# Patient Record
Sex: Male | Born: 2000 | Race: Black or African American | Hispanic: No | Marital: Single | Smoking: Never smoker
Health system: Southern US, Community
[De-identification: ages and names within clinical notes are randomized; demographics above are authoritative.]

## PROBLEM LIST (undated history)

## (undated) HISTORY — PX: KNEE ARTHROSCOPY W/ ACL RECONSTRUCTION: SHX1858

## (undated) HISTORY — PX: NO PAST SURGERIES: SHX2092

---
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
## 2021-06-29 ENCOUNTER — Other Ambulatory Visit (HOSPITAL_COMMUNITY)
Admission: RE | Admit: 2021-06-29 | Discharge: 2021-06-29 | Disposition: A | Discharge status: Home / Self Care | Payer: Managed Care, Other (non HMO) | Source: Ambulatory Visit | Attending: Family Medicine | Admitting: Family Medicine

## 2021-06-29 ENCOUNTER — Encounter: Payer: Self-pay | Admitting: Emergency Medicine

## 2021-06-29 ENCOUNTER — Emergency Department (INDEPENDENT_AMBULATORY_CARE_PROVIDER_SITE_OTHER)
Admission: EM | Admit: 2021-06-29 | Discharge: 2021-06-29 | Disposition: A | Discharge status: Home / Self Care | Payer: Managed Care, Other (non HMO) | Source: Home / Self Care

## 2021-06-29 DIAGNOSIS — R300 Dysuria: Secondary | ICD-10-CM

## 2021-06-29 DIAGNOSIS — Z113 Encounter for screening for infections with a predominantly sexual mode of transmission: Secondary | ICD-10-CM | POA: Diagnosis not present

## 2021-06-29 DIAGNOSIS — Z202 Contact with and (suspected) exposure to infections with a predominantly sexual mode of transmission: Secondary | ICD-10-CM | POA: Diagnosis not present

## 2021-06-29 NOTE — Discharge Instructions (Addendum)
Advised patient we will follow-up with him once Aptima swab results return. ?

## 2021-06-29 NOTE — ED Triage Notes (Signed)
Burning w/ urinary sensation x 1 week ?Sexually active - normally wears a condom - did not this time approx 2 weeks ago  ?

## 2021-06-29 NOTE — ED Provider Notes (Signed)
?[facility] ? ? ? ?CSN: [id number] ?Arrival date & time: 06/29/21  1921 ? ? ?  ? ?History   ?Chief Complaint ?Chief Complaint  ?Patient presents with  ? Dysuria  ? ? ?HPI ?Timothy Orozco is a 21 y.o. male.  ? ?HPI 21 year old male presents with burning urinary symptoms for 1 week.  Patient reports unprotected sex approximately 2 weeks ago. ? ?History reviewed. No pertinent past medical history. ? ?Patient Active Problem List  ? Diagnosis Date Noted  ? Undiagnosed cardiac murmurs 09/11/2017  ? ? ?Past Surgical History:  ?Procedure Laterality Date  ? KNEE ARTHROSCOPY W/ ACL RECONSTRUCTION Left   ? ? ? ? ? ?Home Medications   ? ?Prior to Admission medications   ?Not on File  ? ? ?Family History ?Family History  ?Problem Relation Age of Onset  ? Hypertension Mother   ? Healthy Brother   ? Healthy Brother   ? Healthy Brother   ? Healthy Sister   ? ? ?Social History ?Social History  ? ?Tobacco Use  ? Smoking status: Never  ? Smokeless tobacco: Never  ?Vaping Use  ? Vaping Use: Never used  ?Substance Use Topics  ? Alcohol use: No  ?  Alcohol/week: 0.0 standard drinks  ? Drug use: No  ? ? ? ?Allergies   ?Patient has no known allergies. ? ? ?Review of Systems ?Review of Systems  ?Genitourinary:  Positive for dysuria.  ? ? ?Physical Exam ?Triage Vital Signs ?ED Triage Vitals  ?Enc Vitals Group  ?   BP 06/29/21 1932 117/78  ?   Pulse Rate 06/29/21 1932 68  ?   Resp 06/29/21 1932 15  ?   Temp 06/29/21 1932 98.8 ?F (37.1 ?C)  ?   Temp Source 06/29/21 1932 Oral  ?   SpO2 06/29/21 1932 98 %  ?   Weight 06/29/21 1934 139 lb (63 kg)  ?   Height --   ?   Head Circumference --   ?   Peak Flow --   ?   Pain Score 06/29/21 1934 0  ?   Pain Loc --   ?   Pain Edu? --   ?   Excl. in GC? --   ? ?No data found. ? ?Updated Vital Signs ?BP 117/78 (BP Location: Right Arm)   Pulse 68   Temp 98.8 ?F (37.1 ?C) (Oral)   Resp 15   Wt 139 lb (63 kg)   SpO2 98%  ? ?  ? ?Physical Exam ?Vitals and nursing note reviewed.  ?Constitutional:    ?   General: He is not in acute distress. ?   Appearance: Normal appearance. He is normal weight. He is not ill-appearing.  ?HENT:  ?   Head: Normocephalic and atraumatic.  ?   Mouth/Throat:  ?   Mouth: Mucous membranes are moist.  ?   Pharynx: Oropharynx is clear.  ?Eyes:  ?   Extraocular Movements: Extraocular movements intact.  ?   Conjunctiva/sclera: Conjunctivae normal.  ?   Pupils: Pupils are equal, round, and reactive to light.  ?Cardiovascular:  ?   Rate and Rhythm: Normal rate and regular rhythm.  ?   Pulses: Normal pulses.  ?   Heart sounds: Normal heart sounds.  ?Pulmonary:  ?   Effort: Pulmonary effort is normal.  ?   Breath sounds: Normal breath sounds. No wheezing, rhonchi or rales.  ?Musculoskeletal:  ?   Cervical back: Normal range of motion and neck supple.  ?Skin: ?  General: Skin is warm and dry.  ?Neurological:  ?   General: No focal deficit present.  ?   Mental Status: He is alert and oriented to person, place, and time. Mental status is at baseline.  ? ? ? ?UC Treatments / Results  ?Labs ?(all labs ordered are listed, but only abnormal results are displayed) ?Labs Reviewed  ?CYTOLOGY, (ORAL, ANAL, URETHRAL) ANCILLARY ONLY  ? ? ?EKG ? ? ?Radiology ?No results found. ? ?Procedures ?Procedures (including critical care time) ? ?Medications Ordered in UC ?Medications - No data to display ? ?Initial Impression / Assessment and Plan / UC Course  ?I have reviewed the triage vital signs and the nursing notes. ? ?Pertinent labs & imaging results that were available during my care of the patient were reviewed by me and considered in my medical decision making (see chart for details). ? ?  ? ?MDM: 1.  Potential exposure to STD-Aptima swab ordered. Advised patient we will follow-up with him once Aptima swab results return.  Patient discharged home, hemodynamically stable. ?Final Clinical Impressions(s) / UC Diagnoses  ? ?Final diagnoses:  ?Potential exposure to STD  ? ? ? ?Discharge Instructions   ? ?   ?Advised patient we will follow-up with him once Aptima swab results return. ? ? ? ? ?ED Prescriptions   ?None ?  ? ?PDMP not reviewed this encounter. ?  ?Trevor Iha, FNP ?06/29/21 2005 ? ?

## 2021-07-01 ENCOUNTER — Telehealth: Payer: Self-pay

## 2021-07-01 LAB — CYTOLOGY, (ORAL, ANAL, URETHRAL) ANCILLARY ONLY
Chlamydia: POSITIVE — AB
Comment: NEGATIVE
Comment: NEGATIVE
Comment: NORMAL
Neisseria Gonorrhea: NEGATIVE
Trichomonas: NEGATIVE

## 2021-07-01 MED ORDER — DOXYCYCLINE HYCLATE 100 MG PO CAPS: ORAL_CAPSULE | ORAL | 0 refills | Status: DC

## 2021-07-01 NOTE — Telephone Encounter (Signed)
Pt called regarding his test results. Pt was informed of results and wanted to know when can he start treatment. ? ? ?Pt uses [facility] on [address] in [place], [place]. ?

## 2021-07-01 NOTE — Telephone Encounter (Signed)
For chlamydia, begin doxycycline 100mg  BID for 7 days. ?

## 2021-09-03 IMAGING — DX DG CHEST 2V
2 series · 2 of 2 positions shown · non-contrast
Comparison: None.

CLINICAL DATA: Shortness of breath with cough and chest tightness

EXAM:
CHEST - 2 VIEW

[chest pa]
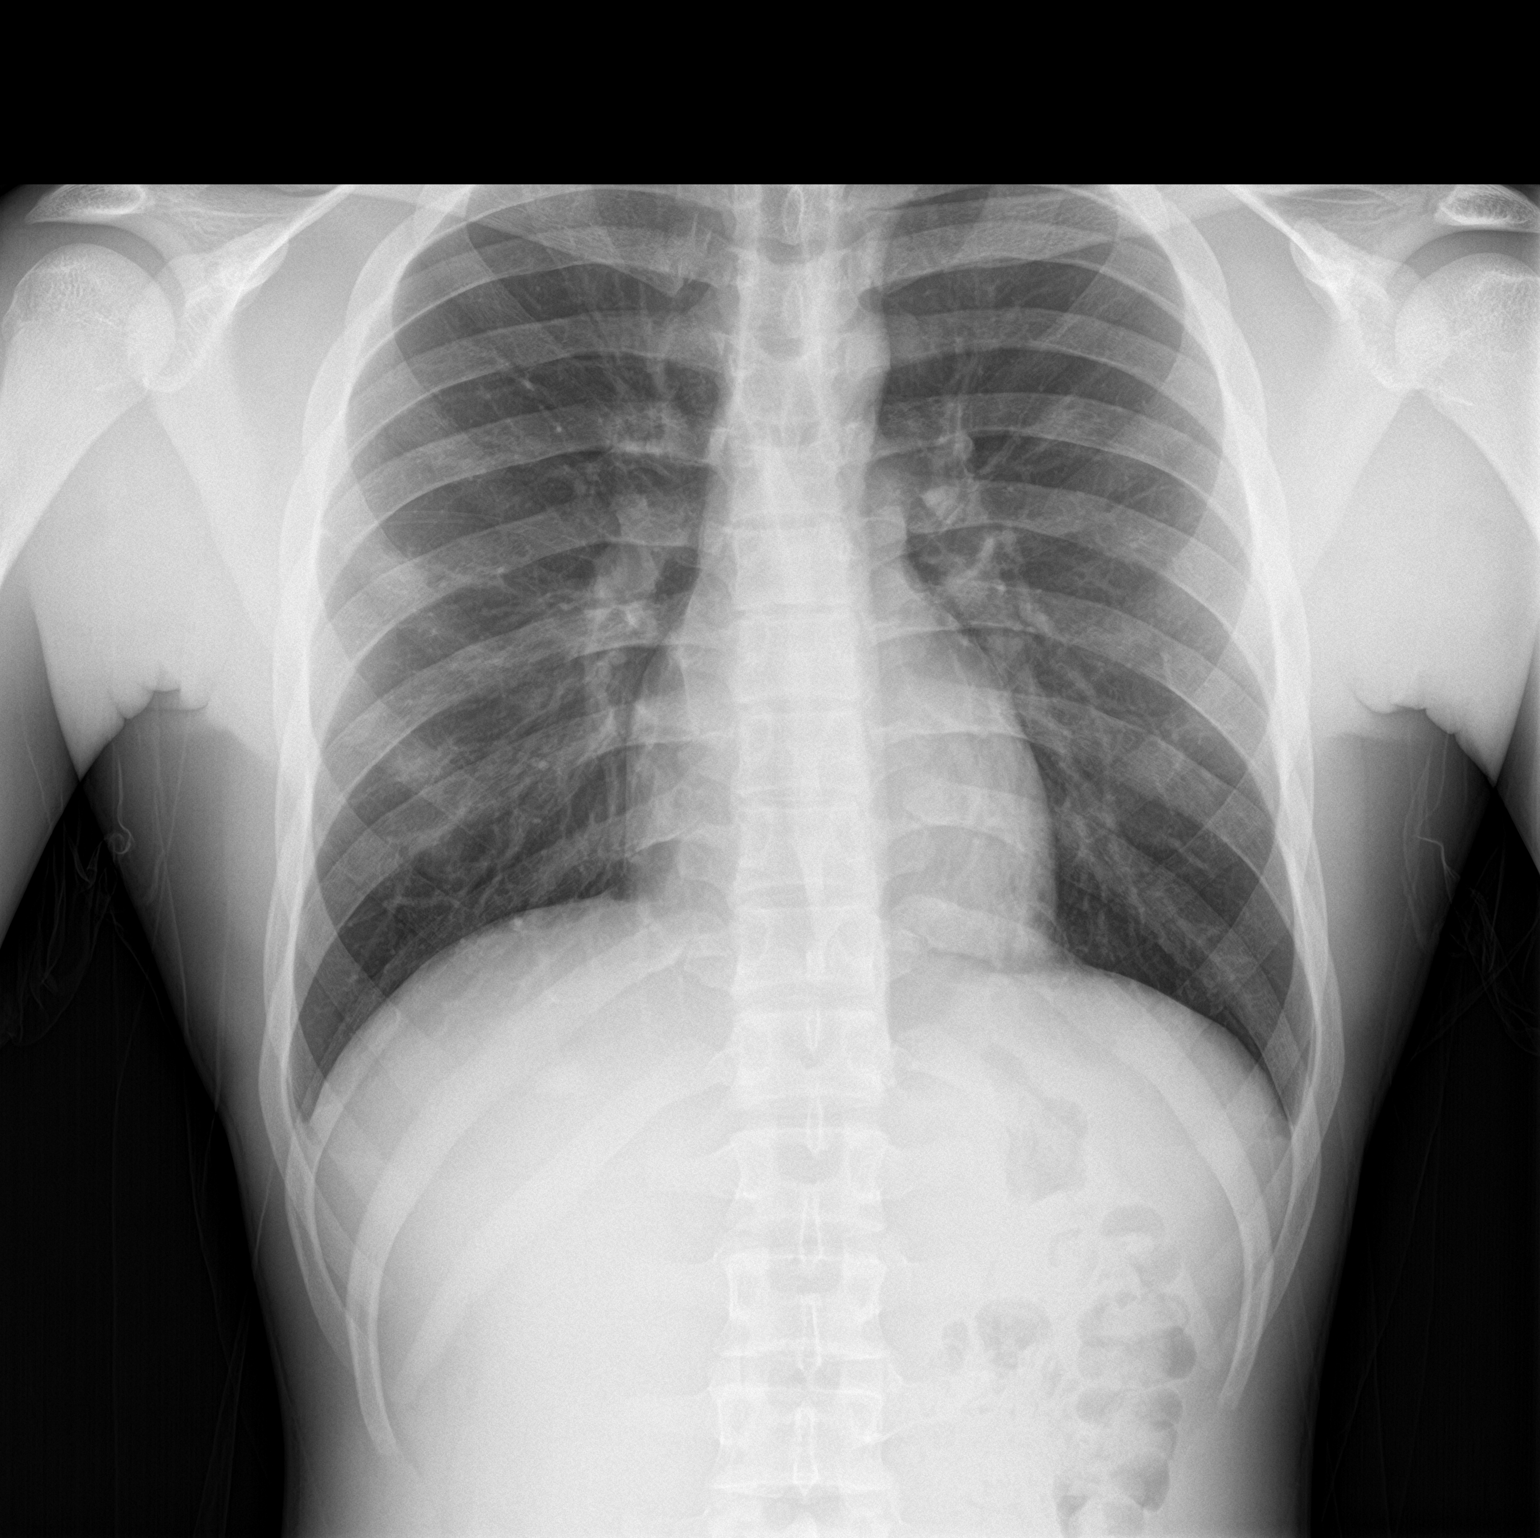

[chest lat]
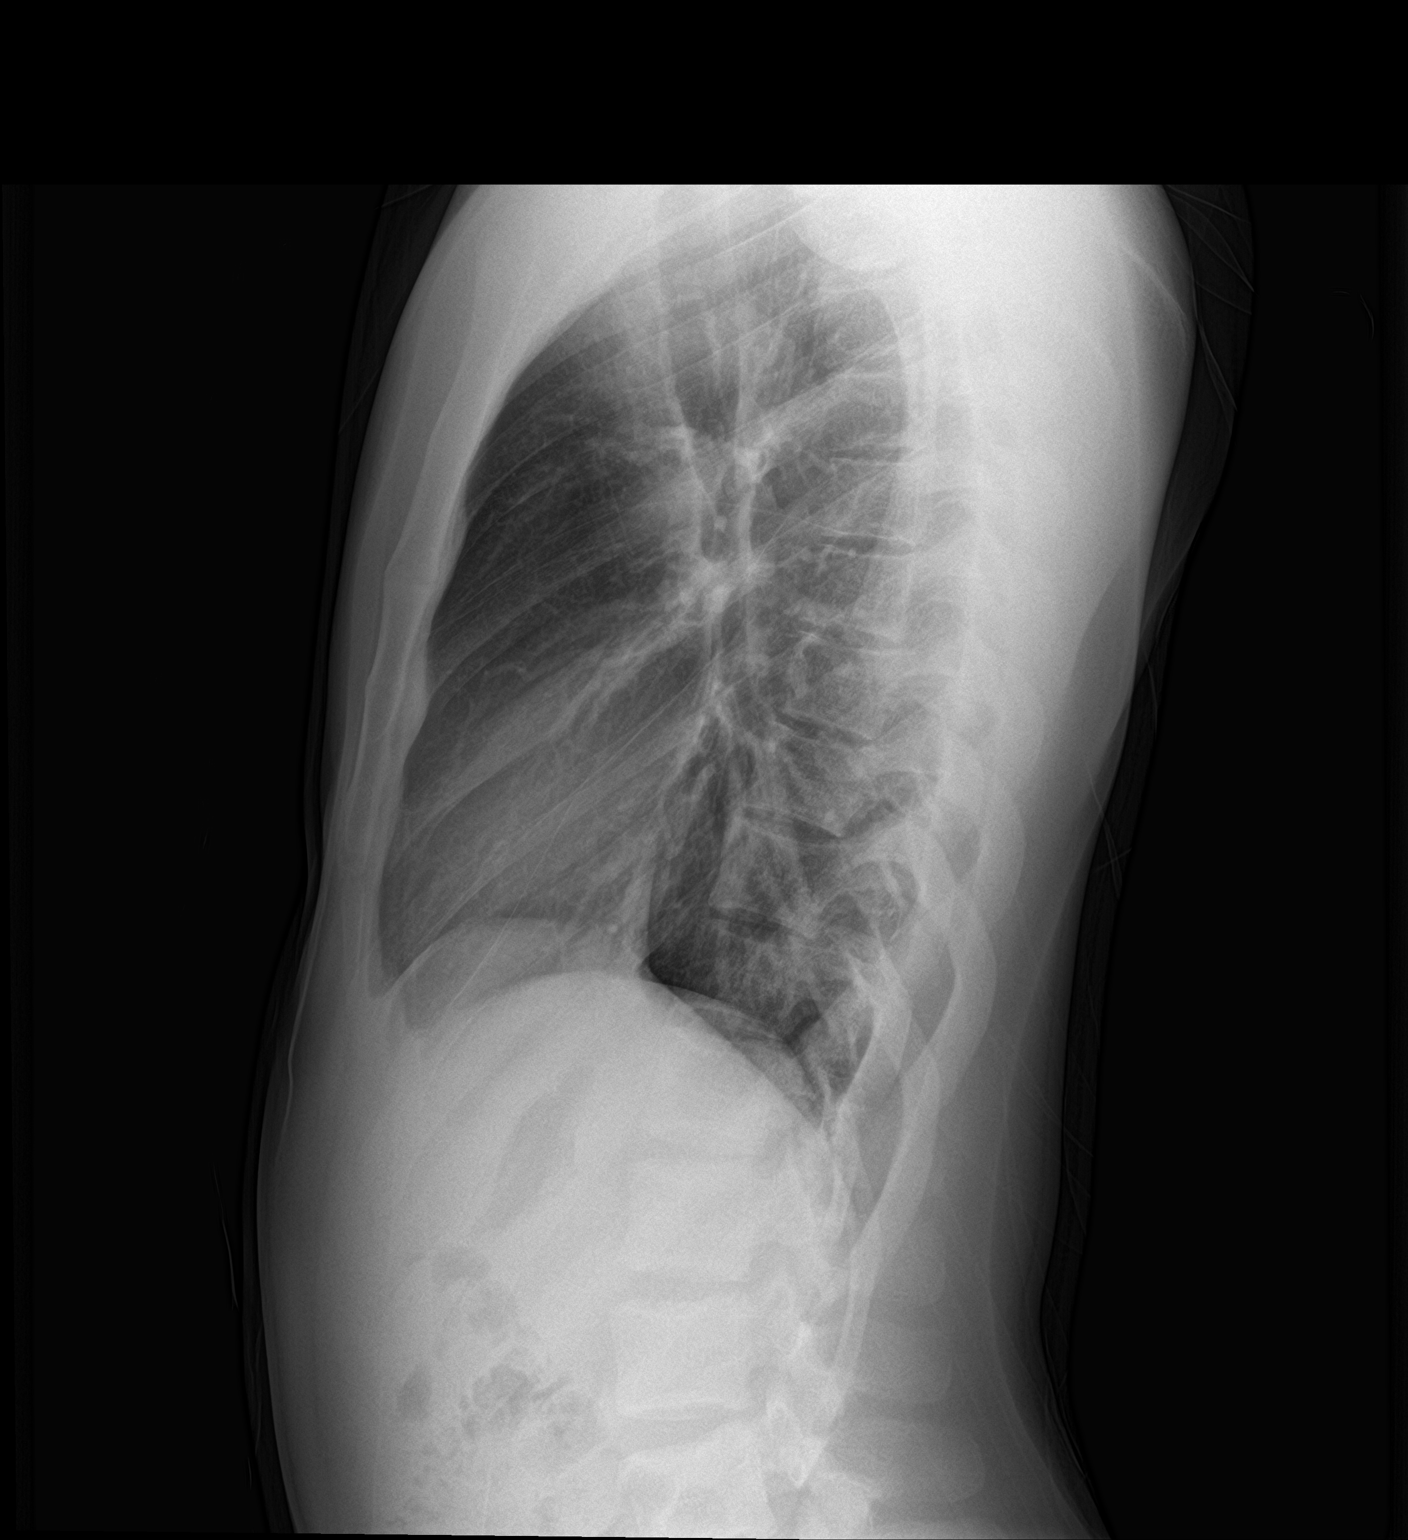

[2 of 2 positions shown; findings below may reference images not displayed]

FINDINGS: Lungs are clear. Heart size and pulmonary vascularity are normal. No
adenopathy. No pneumothorax. No bone lesions.
IMPRESSION: No abnormality noted.

## 2022-04-10 ENCOUNTER — Ambulatory Visit
Admission: EM | Admit: 2022-04-10 | Discharge: 2022-04-10 | Disposition: A | Discharge status: Home / Self Care | Payer: Managed Care, Other (non HMO) | Attending: Emergency Medicine | Admitting: Emergency Medicine

## 2022-04-10 DIAGNOSIS — J039 Acute tonsillitis, unspecified: Secondary | ICD-10-CM

## 2022-04-10 DIAGNOSIS — J020 Streptococcal pharyngitis: Secondary | ICD-10-CM

## 2022-04-10 DIAGNOSIS — J36 Peritonsillar abscess: Secondary | ICD-10-CM | POA: Diagnosis not present

## 2022-04-10 LAB — POCT RAPID STREP A (OFFICE): Rapid Strep A Screen: POSITIVE — AB

## 2022-04-10 MED ORDER — CEFDINIR 300 MG PO CAPS: 300.0000 mg | ORAL_CAPSULE | Freq: Two times a day (BID) | ORAL | 0 refills | Status: AC

## 2022-04-10 NOTE — ED Triage Notes (Signed)
Pt c/o sore throat (right side) that began about a week ago.   Home interventions: none

## 2022-04-10 NOTE — ED Provider Notes (Signed)
[facility]    CSN: [id number] Arrival date & time: 04/10/22  1556    HISTORY   Chief Complaint  Patient presents with   Sore Throat   HPI Timothy Orozco is a pleasant, 22 y.o. male who presents to urgent care today. Patient complains of right-sided throat pain that began about a week ago.  Patient states has not tried anything for his symptoms.  Patient also reports headache, pain with swallowing.  Patient denies difficulty maintaining secretions or managing his airways.  Patient denies fever, body aches, chills.  Patient states he has been feeling more tired than usual for the past week.  The history is provided by the patient.  Sore Throat   History reviewed. No pertinent past medical history. Patient Active Problem List   Diagnosis Date Noted   Undiagnosed cardiac murmurs 09/11/2017   Past Surgical History:  Procedure Laterality Date   KNEE ARTHROSCOPY W/ ACL RECONSTRUCTION Left     Home Medications    Prior to Admission medications   Medication Sig Start Date End Date Taking? Authorizing Provider  doxycycline (VIBRAMYCIN) 100 MG capsule Take one cap PO Q12hr with food. 07/01/21   Lattie Haw, MD    Family History Family History  Problem Relation Age of Onset   Hypertension Mother    Healthy Brother    Healthy Brother    Healthy Brother    Healthy Sister    Social History Social History   Tobacco Use   Smoking status: Never   Smokeless tobacco: Never  Vaping Use   Vaping Use: Never used  Substance Use Topics   Alcohol use: No    Alcohol/week: 0.0 standard drinks of alcohol   Drug use: No   Allergies   Patient has no known allergies.  Review of Systems Review of Systems Pertinent findings revealed after performing a 14 point review of systems has been noted in the history of present illness.  Physical Exam Triage Vital Signs ED Triage Vitals  Enc Vitals Group     BP 01/28/21 0827 (!) 147/82     Pulse Rate 01/28/21 0827 72      Resp 01/28/21 0827 18     Temp 01/28/21 0827 98.3 F (36.8 C)     Temp Source 01/28/21 0827 Oral     SpO2 01/28/21 0827 98 %     Weight --      Height --      Head Circumference --      Peak Flow --      Pain Score 01/28/21 0826 5     Pain Loc --      Pain Edu? --      Excl. in GC? --   No data found.  Updated Vital Signs BP 101/70 (BP Location: Left Arm)   Pulse 83   Temp 97.8 F (36.6 C) (Oral)   Resp 16   Wt 131 lb (59.4 kg)   SpO2 98%   Physical Exam Vitals and nursing note reviewed.  Constitutional:      General: He is awake. He is not in acute distress.    Appearance: He is well-developed, well-groomed and normal weight. He is ill-appearing. He is not toxic-appearing.  HENT:     Head: Normocephalic and atraumatic.     Salivary Glands: Right salivary gland is diffusely enlarged and tender. Left salivary gland is diffusely enlarged. Left salivary gland is not tender.     Right Ear: Hearing and external ear normal.  Left Ear: Hearing and external ear normal.     Ears:     Comments: Bilateral EACs with mild erythema, bilateral TMs are normal    Nose: Nose normal. No mucosal edema, congestion or rhinorrhea.     Right Turbinates: Not enlarged, swollen or pale.     Left Turbinates: Not enlarged, swollen or pale.     Right Sinus: No maxillary sinus tenderness or frontal sinus tenderness.     Left Sinus: No maxillary sinus tenderness or frontal sinus tenderness.     Mouth/Throat:     Lips: Pink. No lesions.     Mouth: Mucous membranes are moist. No oral lesions or angioedema.     Dentition: No gingival swelling.     Tongue: No lesions.     Palate: No mass.     Pharynx: Uvula midline. Pharyngeal swelling, oropharyngeal exudate and posterior oropharyngeal erythema present. No uvula swelling.     Tonsils: Tonsillar exudate present. 2+ on the right. 1+ on the left.  Eyes:     Extraocular Movements: Extraocular movements intact.     Conjunctiva/sclera: Conjunctivae  normal.     Pupils: Pupils are equal, round, and reactive to light.  Neck:     Thyroid: No thyroid mass, thyromegaly or thyroid tenderness.     Trachea: Tracheal tenderness present. No abnormal tracheal secretions or tracheal deviation.     Comments: Voice is muffled Cardiovascular:     Rate and Rhythm: Normal rate and regular rhythm.     Pulses: Normal pulses.     Heart sounds: Normal heart sounds, S1 normal and S2 normal. No murmur heard.    No friction rub. No gallop.  Pulmonary:     Effort: Pulmonary effort is normal. No accessory muscle usage, prolonged expiration, respiratory distress or retractions.     Breath sounds: No stridor, decreased air movement or transmitted upper airway sounds. No decreased breath sounds, wheezing, rhonchi or rales.  Abdominal:     General: Bowel sounds are normal.     Palpations: Abdomen is soft.     Tenderness: There is generalized abdominal tenderness. There is no right CVA tenderness, left CVA tenderness or rebound. Negative signs include Murphy's sign.     Hernia: No hernia is present.  Musculoskeletal:        General: No tenderness. Normal range of motion.     Cervical back: Full passive range of motion without pain, normal range of motion and neck supple.     Right lower leg: No edema.     Left lower leg: No edema.  Lymphadenopathy:     Cervical: Cervical adenopathy present.     Right cervical: Superficial cervical adenopathy present.     Left cervical: Superficial cervical adenopathy present.  Skin:    General: Skin is warm and dry.     Findings: No erythema, lesion or rash.  Neurological:     General: No focal deficit present.     Mental Status: He is alert and oriented to person, place, and time. Mental status is at baseline.  Psychiatric:        Mood and Affect: Mood normal.        Behavior: Behavior normal. Behavior is cooperative.        Thought Content: Thought content normal.        Judgment: Judgment normal.     Visual  Acuity Right Eye Distance:   Left Eye Distance:   Bilateral Distance:    Right Eye Near:   Left Eye  Near:    Bilateral Near:     UC Couse / Diagnostics / Procedures:     Radiology No results found.  Procedures Procedures (including critical care time) EKG  Pending results:  Labs Reviewed  POCT RAPID STREP A (OFFICE) - Abnormal; Notable for the following components:      Result Value   Rapid Strep A Screen Positive (*)    All other components within normal limits    Medications Ordered in UC: Medications - No data to display  UC Diagnoses / Final Clinical Impressions(s)   I have reviewed the triage vital signs and the nursing notes.  Pertinent labs & imaging results that were available during my care of the patient were reviewed by me and considered in my medical decision making (see chart for details).    Final diagnoses:  Acute tonsillitis, unspecified etiology  Peritonsillar cellulitis  Streptococcal pharyngitis   Strep test today is positive.  Will treat patient empirically for presumed streptococcal pharyngitis based on results of rapid strep test.  Patient provided with a 10-day course of cefdinir.  Return precautions advised.    Please see discharge instructions below for further details of plan of care as provided to patient. ED Prescriptions     Medication Sig Dispense Auth. Provider   cefdinir (OMNICEF) 300 MG capsule Take 1 capsule (300 mg total) by mouth 2 (two) times daily for 10 days. 20 capsule Lynden Oxford Scales, PA-C      PDMP not reviewed this encounter.  Disposition Upon Discharge:  Condition: stable for discharge home Home: take medications as prescribed; routine discharge instructions as discussed; follow up as advised.  Patient presented with an acute illness with associated systemic symptoms and significant discomfort requiring urgent management. In my opinion, this is a condition that a prudent lay person (someone who possesses an  average knowledge of health and medicine) may potentially expect to result in complications if not addressed urgently such as respiratory distress, impairment of bodily function or dysfunction of bodily organs.   Routine symptom specific, illness specific and/or disease specific instructions were discussed with the patient and/or caregiver at length.   As such, the patient has been evaluated and assessed, work-up was performed and treatment was provided in alignment with urgent care protocols and evidence based medicine.  Patient/parent/caregiver has been advised that the patient may require follow up for further testing and treatment if the symptoms continue in spite of treatment, as clinically indicated and appropriate.  If the patient was tested for COVID-19, Influenza and/or RSV, then the patient/parent/guardian was advised to isolate at home pending the results of his/her diagnostic coronavirus test and potentially longer if they're positive. I have also advised pt that if his/her COVID-19 test returns positive, it's recommended to self-isolate for at least 10 days after symptoms first appeared AND until fever-free for 24 hours without fever reducer AND other symptoms have improved or resolved. Discussed self-isolation recommendations as well as instructions for household member/close contacts as per the [facility] and [place] [facility], and also gave patient the [place] packet with this information.  Patient/parent/caregiver has been advised to return to the [facility] or PCP in 3-5 days if no better; to PCP or the Emergency Department if new signs and symptoms develop, or if the current signs or symptoms continue to change or worsen for further workup, evaluation and treatment as clinically indicated and appropriate  The patient will follow up with their current PCP if and as advised. If the patient does not currently  have a PCP we will assist them in obtaining one.   The patient may need specialty follow up if the  symptoms continue, in spite of conservative treatment and management, for further workup, evaluation, consultation and treatment as clinically indicated and appropriate.  Patient/parent/caregiver verbalized understanding and agreement of plan as discussed.  All questions were addressed during visit.  Please see discharge instructions below for further details of plan.  Discharge Instructions:   Discharge Instructions      Your strep test today is positive.  I recommend that you begin antibiotics now for treatment.  I have sent a prescription to your pharmacy.  Please take them as prescribed.  You will begin to feel better in about 24 hours.  Please be sure that you you finish the entire 10-day course of treatment to avoid worsening infection that may require longer treatment with stronger antibiotics.   After 24 hours of taking antibiotics, you are no longer contagious.  Please discard your toothbrush as well as any other oral devices that you are currently using and replace them with new ones to avoid reinfection.   Please read below to learn more about the medications, dosages and frequencies that I recommend to help alleviate your symptoms and to get you feeling better soon:   Omnicef (cefdinir):  1 capsule twice daily for 10 days, you can take it with or without food.  This antibiotic can cause upset stomach, this will resolve once antibiotics are complete.  You are welcome to use a probiotic, eat yogurt, take Imodium while taking this medication.  Please avoid other systemic medications such as Maalox, Pepto-Bismol or milk of magnesia as they can interfere with your body's ability to absorb the antibiotics.   Advil, Motrin (ibuprofen): This is a good anti-inflammatory medication which addresses aches, pains and inflammation of the upper airways that causes sinus and nasal congestion as well as in the lower airways which makes your cough feel tight and sometimes burn.  I recommend that you take  between 400 to 600 mg every 6-8 hours as needed.      Please follow-up within the next 7-10 days either with your primary care provider or urgent care if your symptoms do not resolve.  If you do not have a primary care provider, we will assist you in finding one.        Thank you for visiting urgent care today.  We appreciate the opportunity to participate in your care.        This office note has been dictated using Teaching laboratory technician.  Unfortunately, this method of dictation can sometimes lead to typographical or grammatical errors.  I apologize for your inconvenience in advance if this occurs.  Please do not hesitate to reach out to me if clarification is needed.      Theadora Rama Scales, PA-C 04/10/22 1723

## 2022-04-10 NOTE — Discharge Instructions (Addendum)
Your strep test today is positive.  I recommend that you begin antibiotics now for treatment.  I have sent a prescription to your pharmacy.  Please take them as prescribed.  You will begin to feel better in about 24 hours.  Please be sure that you you finish the entire 10-day course of treatment to avoid worsening infection that may require longer treatment with stronger antibiotics.   After 24 hours of taking antibiotics, you are no longer contagious.  Please discard your toothbrush as well as any other oral devices that you are currently using and replace them with new ones to avoid reinfection.   Please read below to learn more about the medications, dosages and frequencies that I recommend to help alleviate your symptoms and to get you feeling better soon:   Omnicef (cefdinir):  1 capsule twice daily for 10 days, you can take it with or without food.  This antibiotic can cause upset stomach, this will resolve once antibiotics are complete.  You are welcome to use a probiotic, eat yogurt, take Imodium while taking this medication.  Please avoid other systemic medications such as Maalox, Pepto-Bismol or milk of magnesia as they can interfere with your body's ability to absorb the antibiotics.   Advil, Motrin (ibuprofen): This is a good anti-inflammatory medication which addresses aches, pains and inflammation of the upper airways that causes sinus and nasal congestion as well as in the lower airways which makes your cough feel tight and sometimes burn.  I recommend that you take between 400 to 600 mg every 6-8 hours as needed.      Please follow-up within the next 7-10 days either with your primary care provider or urgent care if your symptoms do not resolve.  If you do not have a primary care provider, we will assist you in finding one.        Thank you for visiting urgent care today.  We appreciate the opportunity to participate in your care.

## 2022-11-15 ENCOUNTER — Ambulatory Visit
Admission: EM | Admit: 2022-11-15 | Discharge: 2022-11-15 | Disposition: A | Discharge status: Home / Self Care | Payer: Managed Care, Other (non HMO) | Attending: Internal Medicine | Admitting: Internal Medicine

## 2022-11-15 DIAGNOSIS — N4889 Other specified disorders of penis: Secondary | ICD-10-CM | POA: Diagnosis not present

## 2022-11-15 NOTE — ED Provider Notes (Signed)
[facility]    CSN: [id number] Arrival date & time: 11/15/22  1302      History   Chief Complaint Chief Complaint  Patient presents with   Rash    HPI Timothy Orozco is a 22 y.o. male presents for evaluation of bumps on his penis.  Patient reports 4 days of nonpainful little white bumps on the shaft and groin area.  Denies any pain, itching, drainage, swelling.  No dysuria.  Testicular pain or swelling.  No STD concern or exposure.  Denies any history of HPV, genital warts, or syphilis/HSV.  No other concerns at this time.   Rash   History reviewed. No pertinent past medical history.  Patient Active Problem List   Diagnosis Date Noted   Undiagnosed cardiac murmurs 09/11/2017    Past Surgical History:  Procedure Laterality Date   KNEE ARTHROSCOPY W/ ACL RECONSTRUCTION Left        Home Medications    Prior to Admission medications   Not on File    Family History Family History  Problem Relation Age of Onset   Hypertension Mother    Healthy Brother    Healthy Brother    Healthy Brother    Healthy Sister     Social History Social History   Tobacco Use   Smoking status: Never   Smokeless tobacco: Never  Vaping Use   Vaping status: Never Used  Substance Use Topics   Alcohol use: No    Alcohol/week: 0.0 standard drinks of alcohol   Drug use: No     Allergies   Patient has no known allergies.   Review of Systems Review of Systems  Skin:  Positive for rash.     Physical Exam Triage Vital Signs ED Triage Vitals  Encounter Vitals Group     BP 11/15/22 1314 101/62     Systolic BP Percentile --      Diastolic BP Percentile --      Pulse Rate 11/15/22 1313 90     Resp 11/15/22 1313 17     Temp 11/15/22 1313 98.1 F (36.7 C)     Temp Source 11/15/22 1313 Oral     SpO2 11/15/22 1313 95 %     Weight --      Height --      Head Circumference --      Peak Flow --      Pain Score 11/15/22 1313 0     Pain Loc --      Pain Education  --      Exclude from Growth Chart --    No data found.  Updated Vital Signs BP 101/62   Pulse 90   Temp 98.1 F (36.7 C) (Oral)   Resp 17   SpO2 95%   Visual Acuity Right Eye Distance:   Left Eye Distance:   Bilateral Distance:    Right Eye Near:   Left Eye Near:    Bilateral Near:     Physical Exam Vitals and nursing note reviewed. Exam conducted with a chaperone present Timothy Penning RN).  Constitutional:      General: He is not in acute distress.    Appearance: Normal appearance. He is not ill-appearing.  HENT:     Head: Normocephalic and atraumatic.  Eyes:     Pupils: Pupils are equal, round, and reactive to light.  Cardiovascular:     Rate and Rhythm: Normal rate.  Pulmonary:     Effort: Pulmonary effort is normal.  Genitourinary:  Comments: Three single papules tot he penile shaft and one pelvic area.  No vesicles, lesions, ulcerations, penile discharge, swelling, erythema.  Areas are nontender to palpation. Skin:    General: Skin is warm and dry.  Neurological:     General: No focal deficit present.     Mental Status: He is alert and oriented to person, place, and time.  Psychiatric:        Mood and Affect: Mood normal.        Behavior: Behavior normal.      UC Treatments / Results  Labs (all labs ordered are listed, but only abnormal results are displayed) Labs Reviewed - No data to display  EKG   Radiology No results found.  Procedures Procedures (including critical care time)  Medications Ordered in UC Medications - No data to display  Initial Impression / Assessment and Plan / UC Course  I have reviewed the triage vital signs and the nursing notes.  Pertinent labs & imaging results that were available during my care of the patient were reviewed by me and considered in my medical decision making (see chart for details).     Reviewed exam and symptoms with patient.  No red flags.  Reassurance provided that these are benign penile  papules that do not require treatment.  Patient declined any STD testing while in clinic.  advised follow-up with PCP or urology if symptoms persist. Final Clinical Impressions(s) / UC Diagnoses   Final diagnoses:  Penile papules     Discharge Instructions      The bumps on your penis are benign and do not require any treatment. Please follow-up with your PCP or urologist if symptoms persist.   ED Prescriptions   None    PDMP not reviewed this encounter.   Radford Pax, NP 11/15/22 1332

## 2022-11-15 NOTE — ED Triage Notes (Signed)
Pt presents with c/o bump on his penis X 4 days.   States he has a lot of bumps on his groin.

## 2022-11-15 NOTE — Discharge Instructions (Signed)
The bumps on your penis are benign and do not require any treatment. Please follow-up with your PCP or urologist if symptoms persist.

## 2023-10-19 ENCOUNTER — Ambulatory Visit
Admission: EM | Admit: 2023-10-19 | Discharge: 2023-10-19 | Disposition: A | Discharge status: Home / Self Care | Attending: Family Medicine | Admitting: Family Medicine

## 2023-10-19 DIAGNOSIS — Z113 Encounter for screening for infections with a predominantly sexual mode of transmission: Secondary | ICD-10-CM | POA: Insufficient documentation

## 2023-10-19 NOTE — ED Triage Notes (Signed)
 Pt presents fro STD's test. Denies any sign and symptoms.

## 2023-10-19 NOTE — Discharge Instructions (Addendum)
 Will see the blood test results for HIV and syphilis tomorrow. The penis swab results for gonorrhea, chlamydia and trichomonas will be seen Monday. We will treat any infection you test positive for using those results.

## 2023-10-19 NOTE — ED Provider Notes (Signed)
  [facility]  Note:  This document was prepared using Conservation officer, historic buildings and may include unintentional dictation errors.  MRN: [id number] DOB: 11/28/2000  Subjective:   Timothy Orozco is a 23 y.o. male presenting for STI check. No known exposures. Denies dysuria, hematuria, urinary frequency, penile discharge, penile swelling, testicular pain, testicular swelling, anal pain, groin pain.   No current facility-administered medications for this encounter. No current outpatient medications on file.   No Known Allergies  History reviewed. No pertinent past medical history.   Past Surgical History:  Procedure Laterality Date   KNEE ARTHROSCOPY W/ ACL RECONSTRUCTION Left     Family History  Problem Relation Age of Onset   Hypertension Mother    Healthy Brother    Healthy Brother    Healthy Brother    Healthy Sister     Social History   Tobacco Use   Smoking status: Never   Smokeless tobacco: Never  Vaping Use   Vaping status: Never Used  Substance Use Topics   Alcohol use: No    Alcohol/week: 0.0 standard drinks of alcohol   Drug use: No    ROS   Objective:   Vitals: BP 111/72 (BP Location: Right Arm)   Pulse 82   Temp (!) 97.5 F (36.4 C) (Oral)   Resp 16   SpO2 97%   Physical Exam Constitutional:      General: He is not in acute distress.    Appearance: Normal appearance. He is well-developed and normal weight. He is not ill-appearing, toxic-appearing or diaphoretic.  HENT:     Head: Normocephalic and atraumatic.     Right Ear: External ear normal.     Left Ear: External ear normal.     Nose: Nose normal.     Mouth/Throat:     Pharynx: Oropharynx is clear.  Eyes:     General: No scleral icterus.       Right eye: No discharge.        Left eye: No discharge.     Extraocular Movements: Extraocular movements intact.  Cardiovascular:     Rate and Rhythm: Normal rate.  Pulmonary:     Effort: Pulmonary effort is  normal.  Musculoskeletal:     Cervical back: Normal range of motion.  Neurological:     Mental Status: He is alert and oriented to person, place, and time.  Psychiatric:        Mood and Affect: Mood normal.        Behavior: Behavior normal.        Thought Content: Thought content normal.        Judgment: Judgment normal.     Assessment and Plan :   PDMP not reviewed this encounter.  1. Screen for STD (sexually transmitted disease)    Will treat based on the lab results.   Christopher Savannah, [place] 10/19/23 1941

## 2023-10-21 LAB — RPR: RPR Ser Ql: NONREACTIVE

## 2023-10-21 LAB — HIV ANTIBODY (ROUTINE TESTING W REFLEX): HIV Screen 4th Generation wRfx: NONREACTIVE

## 2023-10-22 LAB — CYTOLOGY, (ORAL, ANAL, URETHRAL) ANCILLARY ONLY
Chlamydia: NEGATIVE
Comment: NEGATIVE
Comment: NEGATIVE
Comment: NORMAL
Neisseria Gonorrhea: NEGATIVE
Trichomonas: NEGATIVE

## 2024-02-29 ENCOUNTER — Ambulatory Visit
Admission: RE | Admit: 2024-02-29 | Discharge: 2024-02-29 | Disposition: A | Discharge status: Home / Self Care | Source: Ambulatory Visit

## 2024-02-29 ENCOUNTER — Other Ambulatory Visit: Payer: Self-pay

## 2024-02-29 VITALS — BP 102/74 | HR 67 | Temp 97.9°F | Resp 17

## 2024-02-29 DIAGNOSIS — M5431 Sciatica, right side: Secondary | ICD-10-CM

## 2024-02-29 MED ORDER — BACLOFEN 10 MG PO TABS: 10.0000 mg | ORAL_TABLET | Freq: Three times a day (TID) | ORAL | 0 refills | Status: AC

## 2024-02-29 MED ORDER — DICLOFENAC SODIUM 50 MG PO TBEC: 50.0000 mg | DELAYED_RELEASE_TABLET | Freq: Two times a day (BID) | ORAL | 1 refills | Status: AC

## 2024-02-29 NOTE — ED Triage Notes (Signed)
 Pt c/o right lower back pain that radiates down right legxone month. Pt states injured lower back a couple of months ago lifting heavy things at work and recently he started running and it started hurting again

## 2024-02-29 NOTE — Discharge Instructions (Addendum)
  1. Sciatic nerve pain, right (Primary) - baclofen (LIORESAL) 10 MG tablet; Take 1 tablet (10 mg total) by mouth 3 (three) times daily.  Dispense: 30 each; Refill: 0 - diclofenac (VOLTAREN) 50 MG EC tablet; Take 1 tablet (50 mg total) by mouth 2 (two) times daily.  Dispense: 30 tablet; Refill: 1 - Gentle stretching before exercise and at the end of the day to relieve muscle tension and pressure on the sciatic nerve may relieve pain and prevent further injury.  -Continue to monitor symptoms for any change in severity if there is any escalation of current symptoms or development of new symptoms follow-up in ER for further evaluation and management.

## 2024-02-29 NOTE — ED Provider Notes (Signed)
 [facility]  Note:  This document was prepared using Dragon voice recognition software and may include unintentional dictation errors.  MRN: [id number] DOB: 11-Sep-2000  Subjective:   Timothy Orozco is a 23 y.o. male presenting for right sided lower back pain with radiation down right leg x 1 month.  Patient reports he initially believes he injured his lower back lifting heavy objects at work but states that symptoms improved after a week or 2.  Patient states that recently while running with his brother and exercising he injured his back again and had similar symptoms to previous.  Patient has been taking ibuprofen  with minimal improvement.  Patient reports pain is running down the back of his right leg through his right gluteus.  Patient states that he looked it up and thought  his symptoms were consistent with sciatica and is here for evaluation and treatment.  Patient denies any severe back injury or trauma.   No current facility-administered medications for this encounter.  Current Outpatient Medications:    baclofen (LIORESAL) 10 MG tablet, Take 1 tablet (10 mg total) by mouth 3 (three) times daily., Disp: 30 each, Rfl: 0   diclofenac (VOLTAREN) 50 MG EC tablet, Take 1 tablet (50 mg total) by mouth 2 (two) times daily., Disp: 30 tablet, Rfl: 1   No Known Allergies  History reviewed. No pertinent past medical history.   Past Surgical History:  Procedure Laterality Date   KNEE ARTHROSCOPY W/ ACL RECONSTRUCTION Left     Family History  Problem Relation Age of Onset   Hypertension Mother    Healthy Brother    Healthy Brother    Healthy Brother    Healthy Sister     Social History   Tobacco Use   Smoking status: Never   Smokeless tobacco: Never  Vaping Use   Vaping status: Never Used  Substance Use Topics   Alcohol use: No    Alcohol/week: 0.0 standard drinks of alcohol   Drug use: No    ROS Refer to HPI for ROS details.  Objective:    Vitals: BP  102/74   Pulse 67   Temp 97.9 F (36.6 C) (Oral)   Resp 17   SpO2 98%   Physical Exam Vitals and nursing note reviewed.  Constitutional:      General: He is not in acute distress.    Appearance: Normal appearance. He is not ill-appearing or toxic-appearing.  HENT:     Head: Normocephalic.  Cardiovascular:     Rate and Rhythm: Normal rate.  Pulmonary:     Effort: Pulmonary effort is normal. No respiratory distress.  Musculoskeletal:     Lumbar back: Spasms and tenderness present. No swelling, deformity or bony tenderness. Decreased range of motion. Positive right straight leg raise test. Negative left straight leg raise test.  Skin:    General: Skin is warm and dry.     Capillary Refill: Capillary refill takes less than 2 seconds.  Neurological:     General: No focal deficit present.     Mental Status: He is alert and oriented to person, place, and time.  Psychiatric:        Mood and Affect: Mood normal.        Behavior: Behavior normal.     Procedures  No results found for this or any previous visit (from the past 24 hours).  Assessment and Plan :     Discharge Instructions       1. Sciatic nerve pain, right (Primary) -  baclofen (LIORESAL) 10 MG tablet; Take 1 tablet (10 mg total) by mouth 3 (three) times daily.  Dispense: 30 each; Refill: 0 - diclofenac (VOLTAREN) 50 MG EC tablet; Take 1 tablet (50 mg total) by mouth 2 (two) times daily.  Dispense: 30 tablet; Refill: 1 - Gentle stretching before exercise and at the end of the day to relieve muscle tension and pressure on the sciatic nerve may relieve pain and prevent further injury.  -Continue to monitor symptoms for any change in severity if there is any escalation of current symptoms or development of new symptoms follow-up in ER for further evaluation and management.      Jemell Town B Emmy Keng   Egidio Lofgren, [place], [place] 02/29/24 1947
